# Patient Record
Sex: Male | Born: 1980 | Race: White | Hispanic: No | Marital: Married | Smoking: Current every day smoker
Health system: Southern US, Community
[De-identification: ages and names within clinical notes are randomized; demographics above are authoritative.]

---

## 2020-10-20 ENCOUNTER — Encounter (HOSPITAL_COMMUNITY): Payer: Self-pay

## 2020-10-20 ENCOUNTER — Emergency Department (HOSPITAL_COMMUNITY)
Admission: EM | Admit: 2020-10-20 | Discharge: 2020-10-20 | Disposition: A | Discharge status: Home / Self Care | Payer: Self-pay | Attending: Emergency Medicine | Admitting: Emergency Medicine

## 2020-10-20 ENCOUNTER — Emergency Department (HOSPITAL_COMMUNITY): Payer: Self-pay

## 2020-10-20 ENCOUNTER — Other Ambulatory Visit: Payer: Self-pay

## 2020-10-20 DIAGNOSIS — F17210 Nicotine dependence, cigarettes, uncomplicated: Secondary | ICD-10-CM | POA: Insufficient documentation

## 2020-10-20 DIAGNOSIS — R519 Headache, unspecified: Secondary | ICD-10-CM | POA: Insufficient documentation

## 2020-10-20 DIAGNOSIS — M79602 Pain in left arm: Secondary | ICD-10-CM | POA: Insufficient documentation

## 2020-10-20 DIAGNOSIS — R42 Dizziness and giddiness: Secondary | ICD-10-CM | POA: Insufficient documentation

## 2020-10-20 DIAGNOSIS — R202 Paresthesia of skin: Secondary | ICD-10-CM | POA: Insufficient documentation

## 2020-10-20 LAB — CBC WITH DIFFERENTIAL/PLATELET
Abs Immature Granulocytes: 0.02 10*3/uL (ref 0.00–0.07)
Basophils Absolute: 0.1 10*3/uL (ref 0.0–0.1)
Basophils Relative: 1 %
Eosinophils Absolute: 0.6 10*3/uL — ABNORMAL HIGH (ref 0.0–0.5)
Eosinophils Relative: 6 %
HCT: 44.8 % (ref 39.0–52.0)
Hemoglobin: 15.1 g/dL (ref 13.0–17.0)
Immature Granulocytes: 0 %
Lymphocytes Relative: 38 %
Lymphs Abs: 4.2 10*3/uL — ABNORMAL HIGH (ref 0.7–4.0)
MCH: 30.8 pg (ref 26.0–34.0)
MCHC: 33.7 g/dL (ref 30.0–36.0)
MCV: 91.4 fL (ref 80.0–100.0)
Monocytes Absolute: 0.8 10*3/uL (ref 0.1–1.0)
Monocytes Relative: 8 %
Neutro Abs: 5.2 10*3/uL (ref 1.7–7.7)
Neutrophils Relative %: 47 %
Platelets: 346 10*3/uL (ref 150–400)
RBC: 4.9 MIL/uL (ref 4.22–5.81)
RDW: 13.4 % (ref 11.5–15.5)
WBC: 10.9 10*3/uL — ABNORMAL HIGH (ref 4.0–10.5)
nRBC: 0 % (ref 0.0–0.2)

## 2020-10-20 LAB — BASIC METABOLIC PANEL
Anion gap: 7 (ref 5–15)
BUN: 13 mg/dL (ref 6–20)
CO2: 24 mmol/L (ref 22–32)
Calcium: 8.8 mg/dL — ABNORMAL LOW (ref 8.9–10.3)
Chloride: 109 mmol/L (ref 98–111)
Creatinine, Ser: 0.9 mg/dL (ref 0.61–1.24)
GFR, Estimated: >60 mL/min (ref >60)
Glucose, Bld: 109 mg/dL — ABNORMAL HIGH (ref 70–99)
Potassium: 4.7 mmol/L (ref 3.5–5.1)
Sodium: 140 mmol/L (ref 135–145)

## 2020-10-20 LAB — MAGNESIUM: Magnesium: 2.2 mg/dL (ref 1.7–2.4)

## 2020-10-20 MED ORDER — LIDOCAINE VISCOUS HCL 2 % MT SOLN
15.0000 mL | Freq: Once | OROMUCOSAL | Status: AC
  Administered 2020-10-20: 15 mL via ORAL
  Filled 2020-10-20: qty 15

## 2020-10-20 MED ORDER — ALUM & MAG HYDROXIDE-SIMETH 200-200-20 MG/5ML PO SUSP
30.0000 mL | Freq: Once | ORAL | Status: AC
  Administered 2020-10-20: 30 mL via ORAL
  Filled 2020-10-20: qty 30

## 2020-10-20 NOTE — ED Triage Notes (Signed)
patient states that he had left arm numbness and pain, dizziness last night. Patient states today he is still having slight dizziness and fatigue, but denies left arm numbness and pain today.

## 2020-10-20 NOTE — Discharge Instructions (Signed)
You were seen in the emergency department for left-sided arm tingling, heaviness followed by headache  Lab work, imaging today is unremarkable  Please follow-up with [facility] for further discussion of your symptoms  Return to the ED for worsening or new symptoms, sudden severe headache, neck stiffness, fevers, stroke symptoms, chest pain

## 2020-10-20 NOTE — ED Provider Notes (Signed)
[facility]-EMERGENCY DEPT Provider Note   CSN: [id number] Arrival date & time: 10/20/20  1015     History Chief Complaint  Patient presents with  . Arm Pain  . Dizziness  . arm numbness    Anthony Bird is a 40 y.o. male with reported remote history of polysubstance abuse sober for 8 years presents to the ED with his wife for evaluation of left-sided numbness.  This occurred suddenly at 7 PM last night.  Describes it as feeling tingling, like he hit his funny bone in his elbow.  The sensation was throughout the entire left arm.  Had associated feeling of heaviness in the arm as well.  Noticed a few minutes later bilateral neck pain that was actually worse on the right side.  He does not know if maybe he was tensing up from the discomfort in his arm.  This all lasted approximately 1 to 2 hours and eventually completely resolved.  Soon after developed a sudden onset moderate to severe generalized headache.  He had dizziness that he describes as feeling generally weak and lightheaded.  States he occasionally felt like he was swaying when he would stand up.  Feels confused and he elaborates that he feels like it is brain is foggy.  Wife reports he seems like he is having a hard time explaining himself but no frank slurred speech or aphasia.  Patient was actually on his way to the hospital last night but on his way there he felt like his symptoms improved and just had a headache.  So he decided to go back home.  Headache has resolved.  Symptoms in the left arm have also resolved.  Currently just feels foggy headed, really tired and worn out.  Out of caution he took a Covid test at home and this was negative.  No falls.  No previous neck injuries or pinched nerves.  No visual changes, tinnitus, difficulty swallowing, facial drooping.  No falls.  Reports in the past using several drugs including meth, cocaine, mushrooms, and heroin.  No previous IV drug use however.  Smokes marijuana  but no other recreational drugs in the last 8 years.  No alcohol use.  Tobacco use.  Used to have migraines but these have improved.  Patient went to [facility] and was told to come to ER for EKG or a possible problem with his heart. He has no CP, SOB, abdominal pain, nausea, vomiting, diaphoresis.   HPI     History reviewed. No pertinent past medical history.  There are no problems to display for this patient.   History reviewed. No pertinent surgical history.     Family History  Problem Relation Age of Onset  . Healthy Mother   . Healthy Father     Social History   Tobacco Use  . Smoking status: Current Every Day Smoker    Packs/day: 1.00    Types: Cigarettes  . Smokeless tobacco: Never Used  Vaping Use  . Vaping Use: Never used  Substance Use Topics  . Alcohol use: Not Currently  . Drug use: Yes    Types: Marijuana    Home Medications Prior to Admission medications   Medication Sig Start Date End Date Taking? Authorizing Provider  famotidine (PEPCID) 20 MG tablet Take 20 mg by mouth 2 (two) times daily.   Yes [provider]  ibuprofen (ADVIL) 200 MG tablet Take 800-1,000 mg by mouth every 6 (six) hours as needed for headache or mild pain.  Yes [provider]    Allergies    Amoxicillin  Review of Systems   Review of Systems  Constitutional: Positive for fatigue.  Musculoskeletal: Positive for neck pain (Resolved).  Neurological: Positive for dizziness, weakness (Left arm heaviness), numbness (Tingling) and headaches (Resolved).  All other systems reviewed and are negative.   Physical Exam Updated Vital Signs BP 126/77   Pulse (!) 59   Temp 98.4 F (36.9 C) (Oral)   Resp 10   Ht 6\' 3"  (1.905 m)   Wt 113.4 kg   SpO2 97%   BMI 31.25 kg/m   Physical Exam Vitals and nursing note reviewed.  Constitutional:      General: He is not in acute distress.    Appearance: He is well-developed and well-nourished.     Comments: NAD.   HENT:     Head: Normocephalic and atraumatic.     Comments: No temporal tenderness    Right Ear: External ear normal.     Left Ear: External ear normal.     Nose: Nose normal.  Eyes:     General: No scleral icterus.    Extraocular Movements: EOM normal.     Conjunctiva/sclera: Conjunctivae normal.  Neck:     Comments: No reproducible C-spine midline or paraspinal tenderness.  Full range of motion of the neck without any pain.  No carotid bruits.  Trachea midline. Cardiovascular:     Rate and Rhythm: Normal rate and regular rhythm.     Pulses: Intact distal pulses.     Heart sounds: Normal heart sounds. No murmur heard.     Comments: 1+ radial pulses bilaterally Pulmonary:     Effort: Pulmonary effort is normal.     Breath sounds: Normal breath sounds. No wheezing.  Musculoskeletal:        General: No deformity. Normal range of motion.     Cervical back: Normal range of motion and neck supple.  Skin:    General: Skin is warm and dry.     Capillary Refill: Capillary refill takes less than 2 seconds.  Neurological:     Mental Status: He is alert and oriented to person, place, and time.     Comments:   Mental Status: Patient is awake, alert, oriented to person, place, year, and situation. Patient is able to give a clear and coherent history.  Speech is fluent and clear without dysarthria or aphasia.  No signs of neglect.  Cranial Nerves: I not tested II visual fields full bilaterally. PERRL.   III, IV, VI EOMs intact without ptosis V sensation to light touch intact in all 3 divisions of trigeminal nerve bilaterally  VII facial movements symmetric bilaterally VIII hearing intact to voice/conversation  IX, X no uvula deviation, symmetric rise of soft palate/uvula XI 5/5 SCM and trapezius strength bilaterally  XII tongue protrusion midline, symmetric L/R movements  Motor: Strength 5/5 in upper/lower extremities.   Sensation to light touch intact in face, upper/lower  extremities. No pronator drift. No leg drop.  Cerebellar: No ataxia with finger to nose.  Steady gait. No truncal sway. Normal Romberg.   Psychiatric:        Mood and Affect: Mood and affect normal.        Behavior: Behavior normal.        Thought Content: Thought content normal.        Judgment: Judgment normal.     ED Results / Procedures / Treatments   Labs (all labs ordered are listed, but only  abnormal results are displayed) Labs Reviewed  CBC WITH DIFFERENTIAL/PLATELET - Abnormal; Notable for the following components:      Result Value   WBC 10.9 (*)    Lymphs Abs 4.2 (*)    Eosinophils Absolute 0.6 (*)    All other components within normal limits  BASIC METABOLIC PANEL - Abnormal; Notable for the following components:   Glucose, Bld 109 (*)    Calcium 8.8 (*)    All other components within normal limits  MAGNESIUM    EKG None  Radiology CT Head Wo Contrast  Result Date: 10/20/2020 CLINICAL DATA:  Left arm tingling and heaviness, headache, dizziness, neck pain, no history of trauma EXAM: CT HEAD WITHOUT CONTRAST CT CERVICAL SPINE WITHOUT CONTRAST TECHNIQUE: Multidetector CT imaging of the head and cervical spine was performed following the standard protocol without intravenous contrast. Multiplanar CT image reconstructions of the cervical spine were also generated. COMPARISON:  None FINDINGS: CT HEAD FINDINGS Brain: No evidence of acute infarction, hemorrhage, hydrocephalus, extra-axial collection or mass lesion/mass effect. Vascular: No hyperdense vessel or unexpected calcification. Skull: Normal. Negative for fracture or focal lesion. Sinuses/Orbits: No acute finding. Other: None CT CERVICAL SPINE FINDINGS Alignment: Mild reversal of the normal cervical lordosis. No spondylolisthesis. Skull base and vertebrae: No acute fracture. No primary bone lesion or focal pathologic process. Soft tissues and spinal canal: No prevertebral fluid or swelling. No visible canal hematoma.  Disc levels:  No significant degenerative change. Upper chest: Negative. Other: None IMPRESSION: 1. Negative for bleed or other acute intracranial process. 2. Negative for cervical fracture or other acute bone abnormality. 3. Loss of the normal cervical spine lordosis, which may be secondary to positioning, spasm, or soft tissue injury. Electronically Signed   By: Corlis Leak  Hassell M.D.   On: 10/20/2020 13:18   CT Cervical Spine Wo Contrast  Result Date: 10/20/2020 CLINICAL DATA:  Left arm tingling and heaviness, headache, dizziness, neck pain, no history of trauma EXAM: CT HEAD WITHOUT CONTRAST CT CERVICAL SPINE WITHOUT CONTRAST TECHNIQUE: Multidetector CT imaging of the head and cervical spine was performed following the standard protocol without intravenous contrast. Multiplanar CT image reconstructions of the cervical spine were also generated. COMPARISON:  None FINDINGS: CT HEAD FINDINGS Brain: No evidence of acute infarction, hemorrhage, hydrocephalus, extra-axial collection or mass lesion/mass effect. Vascular: No hyperdense vessel or unexpected calcification. Skull: Normal. Negative for fracture or focal lesion. Sinuses/Orbits: No acute finding. Other: None CT CERVICAL SPINE FINDINGS Alignment: Mild reversal of the normal cervical lordosis. No spondylolisthesis. Skull base and vertebrae: No acute fracture. No primary bone lesion or focal pathologic process. Soft tissues and spinal canal: No prevertebral fluid or swelling. No visible canal hematoma. Disc levels:  No significant degenerative change. Upper chest: Negative. Other: None IMPRESSION: 1. Negative for bleed or other acute intracranial process. 2. Negative for cervical fracture or other acute bone abnormality. 3. Loss of the normal cervical spine lordosis, which may be secondary to positioning, spasm, or soft tissue injury. Electronically Signed   By: Corlis Leak  Hassell M.D.   On: 10/20/2020 13:18    Procedures Procedures   Medications Ordered in  ED Medications - No data to display  ED Course  I have reviewed the triage vital signs and the nursing notes.  Pertinent labs & imaging results that were available during my care of the patient were reviewed by me and considered in my medical decision making (see chart for details).  Clinical Course as of 10/20/20 1523  Sat Oct 20, 2020  1321 CT Head Wo Contrast IMPRESSION: 1. Negative for bleed or other acute intracranial process. 2. Negative for cervical fracture or other acute bone abnormality. 3. Loss of the normal cervical spine lordosis, which may be secondary to positioning, spasm, or soft tissue injury. [CG]  1327 Patient case discussed with EDP, will proceed with MRI [CG]    Clinical Course User Index [CG] Liberty Handy, PA-C   MDM Rules/Calculators/A&P                          40 year old presents to the ED for left arm tingling and heaviness 7 PM last night that resolved.  Followed by sudden, moderate to severe headache, brain fogginess, dizziness, fatigue.  Remote history of polysubstance abuse, denies history of IV drug use.  EMR, triage nurse notes reviewed to obtain more history and assist with MDM.  Additional information obtained from wife was at bedside.  Reports patient seemed to have a hard time coming up with words or explaining himself but no frank slurred speech, facial drooping, weakness.  DDx: TIA, CVA, complicated migraine.  Cervical radiculopathy also considered although on exam he has no cervical tenderness, paresthesias.  No associated chest pain or shortness of breath any ACS is unlikely.  Considered vertebral dissection etc however patient without any current/active symptoms, pain. Normal distal neurovascular status. No neck rigidity, headache.  Essentially asymptomatic here.  Dissection less likely. Denies history of IVDU, septic emboli less likely.   Labs, imaging ordered by me as above  Labs and imaging personally visualized and interpreted by  me  Labs reveal-essentially unremarkable.  WC 10.9.  Glucose 109.  Imaging reveals-CT head/cervical spine without significant acute findings.  There is loss of cervical curvature that could be due to positioning, spasm or soft tissue injury.  Patient denies any falls or trauma.  No active neck pain.  EKG without ischemic changes.  Patient has no chest pain.  Patient case discussed with EDP Freida Busman who agrees with moving forward with MRI  Medicines ordered-patient is asymptomatic, none  1520: Patient reevaluated.  Remains asymptomatic.  Pending MRI brain/cervical spine.  Patient care transferred to oncoming ED PA who will follow up on pending MRI and determine disposition.  If negative and no clinical decline or new symptoms, consider discharge with neurology follow-up. ?  Complicated migraine.  Plan discussed with patient and wife were in agreement.  Final Clinical Impression(s) / ED Diagnoses Final diagnoses:  Arm paresthesia, left  Nonintractable headache, unspecified chronicity pattern, unspecified headache type    Rx / DC Orders ED Discharge Orders    None       Jerrell Mylar 10/20/20 1523    Lorre Nick, MD 10/24/20 1013

## 2020-10-20 NOTE — ED Provider Notes (Signed)
Care received from [facility].  Please see her note for full HPI.  In short, 40 year old male with left-sided numbness and weakness has been ongoing since yesterday.  Symptoms have been intermittent.  Work-up by prior provider overall reassuring.  No evidence of TIA/CVA on CT scan.  Low suspicion for carotid dissection as he has no symptoms at this time.  Neuro exam is normal.  Labs overall reassuring.  Care received pending MRI of the cervical spine and brain.  I have personally reviewed the MRI findings which were overall reassuring.  No evidence of infarction, hemorrhage or mass on MRI of the brain, cervical spine with no evidence of stenosis.  On reevaluation, patient remains asymptomatic here in the ER.  He is requesting something for heartburn, will give GI cocktail.  Will give neurology follow-up.  This was discussed with the patient and his wife, they voiced understanding and are agreeable.  Discussed return precautions.  At this stage in the ED course, the patient is medically screened and stable for discharge. Physical Exam  BP 125/81 (BP Location: Right Arm)   Pulse 77   Temp 98.4 F (36.9 C) (Oral)   Resp 18   Ht 6\' 3"  (1.905 m)   Wt 113.4 kg   SpO2 100%   BMI 31.25 kg/m   Physical Exam Vitals reviewed.  Constitutional:      Appearance: Normal appearance.  HENT:     Head: Normocephalic and atraumatic.  Eyes:     General:        Right eye: No discharge.        Left eye: No discharge.     Extraocular Movements: Extraocular movements intact.     Conjunctiva/sclera: Conjunctivae normal.  Cardiovascular:     Rate and Rhythm: Normal rate and regular rhythm.     Pulses: Normal pulses.     Heart sounds: Normal heart sounds.  Pulmonary:     Effort: Pulmonary effort is normal.     Breath sounds: Normal breath sounds.  Abdominal:     General: Abdomen is flat.     Tenderness: There is no abdominal tenderness.  Musculoskeletal:        General: No swelling. Normal range of  motion.     Cervical back: Normal range of motion and neck supple. No tenderness.     Right lower leg: No edema.     Left lower leg: No edema.  Skin:    General: Skin is warm and dry.  Neurological:     General: No focal deficit present.     Mental Status: He is alert and oriented to person, place, and time.     Comments: Mental Status:  Alert, thought content appropriate, able to give a coherent history. Speech fluent without evidence of aphasia. Able to follow 2 step commands without difficulty.  Cranial Nerves:  II: Peripheral visual fields grossly normal, pupils equal, round, reactive to light III,IV, VI: ptosis not present, extra-ocular motions intact bilaterally  V,VII: smile symmetric, facial light touch sensation equal VIII: hearing grossly normal to voice  X: uvula elevates symmetrically  XI: bilateral shoulder shrug symmetric and strong XII: midline tongue extension without fassiculations Motor:  Normal tone. 5/5 strength of BUE and BLE major muscle groups including strong and equal grip strength and dorsiflexion/plantar flexion Sensory: light touch normal in all extremities. Cerebellar: normal finger-to-nose with bilateral upper extremities, Romberg sign absent Gait: normal gait and balance. Able to walk on toes and heels with ease.    Psychiatric:  Mood and Affect: Mood normal.        Behavior: Behavior normal.     ED Course/Procedures   Clinical Course as of 10/20/20 1612  Sat Oct 20, 2020  1321 CT Head Wo Contrast IMPRESSION: 1. Negative for bleed or other acute intracranial process. 2. Negative for cervical fracture or other acute bone abnormality. 3. Loss of the normal cervical spine lordosis, which may be secondary to positioning, spasm, or soft tissue injury. [CG]  1327 Patient case discussed with EDP, will proceed with MRI [CG]  1611 MR BRAIN WO CONTRAST  IMPRESSION: No evidence of recent infarction, hemorrhage, or mass.  [MB]  1611 MR Cervical  Spine Wo Contrast IMPRESSION: No abnormal signal. No stenosis. [MB]    Clinical Course User Index [CG] Liberty Handy, PA-C [MB] Mare Ferrari, PA-C    Procedures  MDM        Leone Brand 10/20/20 1613    Gerhard Munch, MD 10/21/20 0005

## 2022-05-11 IMAGING — CT CT HEAD W/O CM
2 of 3 series · 15 of 44 positions shown, 18 images · non-contrast
Comparison: None

CLINICAL DATA: Left arm tingling and heaviness, headache,
dizziness, neck pain, no history of trauma

EXAM:
CT HEAD WITHOUT CONTRAST
CT CERVICAL SPINE WITHOUT CONTRAST
TECHNIQUE: Multidetector CT imaging of the head and cervical spine was
performed following the standard protocol without intravenous
contrast. Multiplanar CT image reconstructions of the cervical spine
were also generated.

[Series 3: head wo · axial · 0.47mm/px · z∈[-150,-40]mm · 12 of 27 slices shown, 15 images]
[im 3/27  brain]
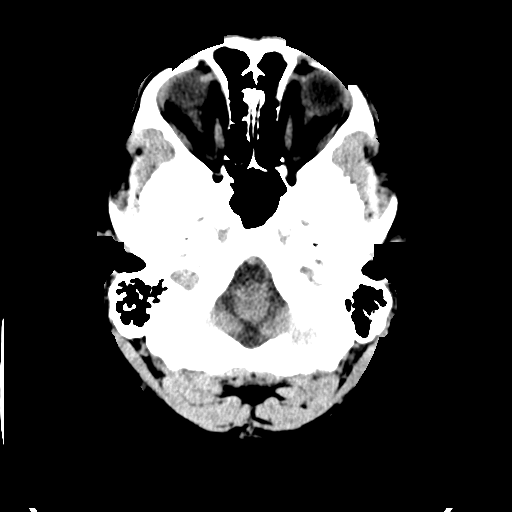
[im 3/27  bone]
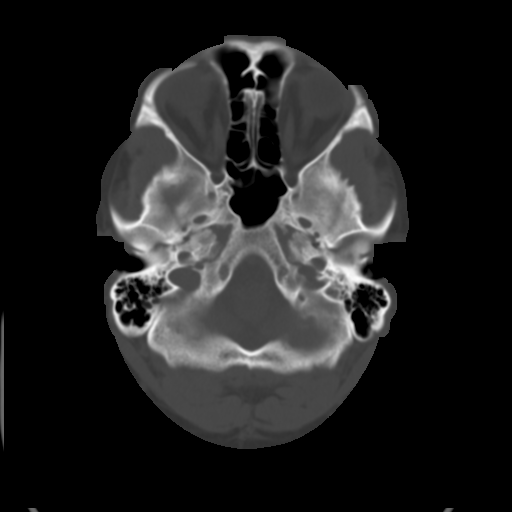
[im 5/27  brain]
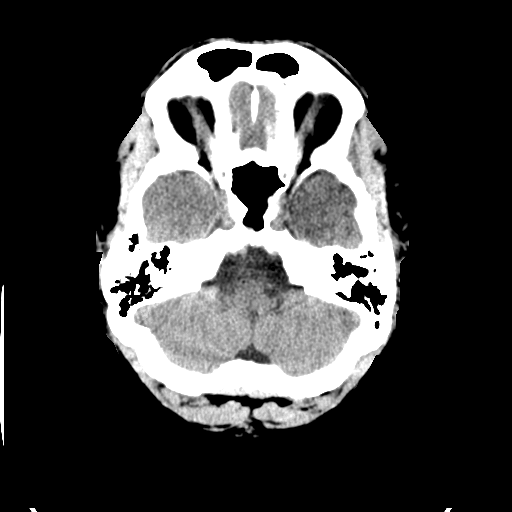
[im 7/27  brain]
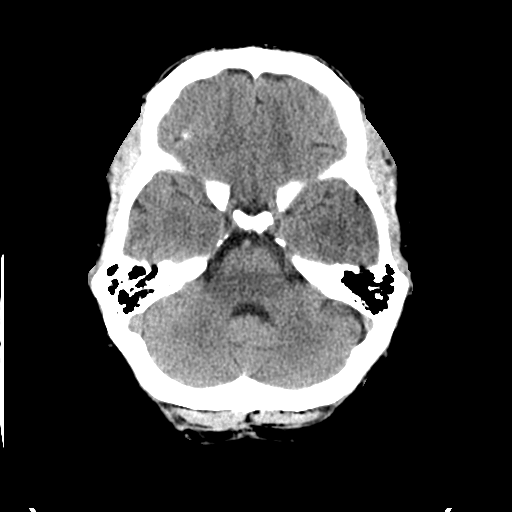
[im 9/27  brain]
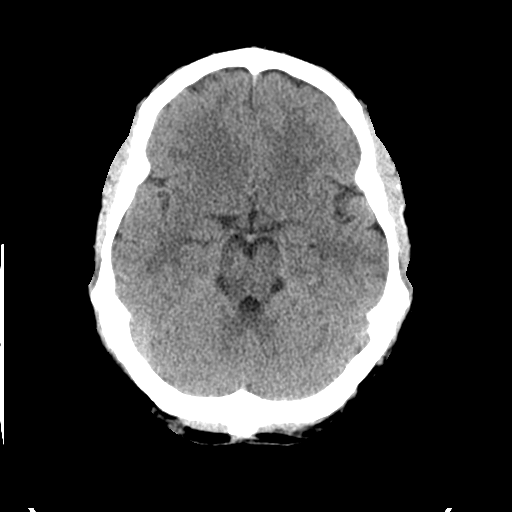
[im 11/27  brain]
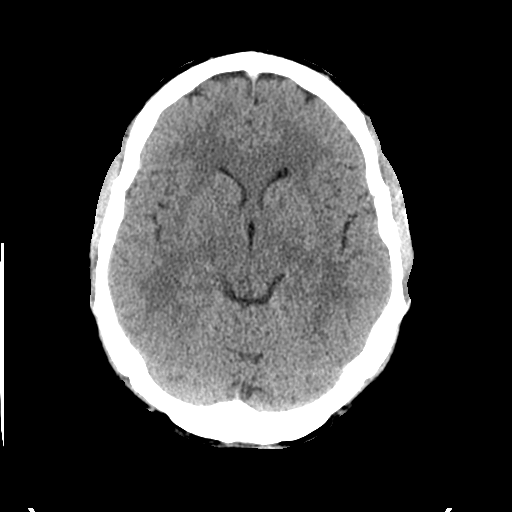
[im 11/27  bone]
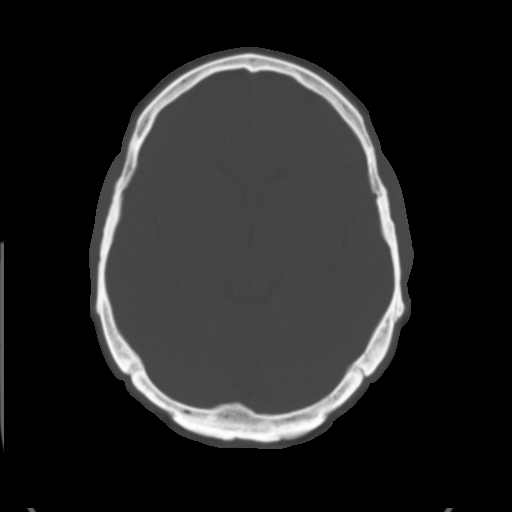
[im 13/27  brain]
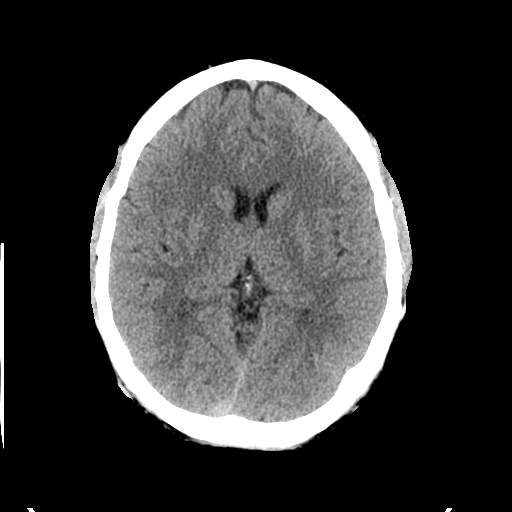
[im 15/27  brain]
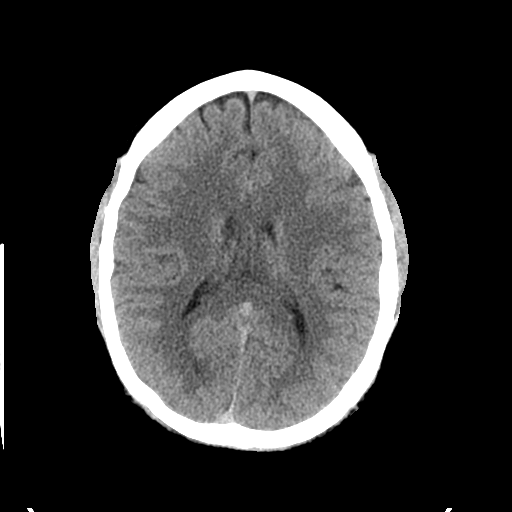
[im 17/27  brain]
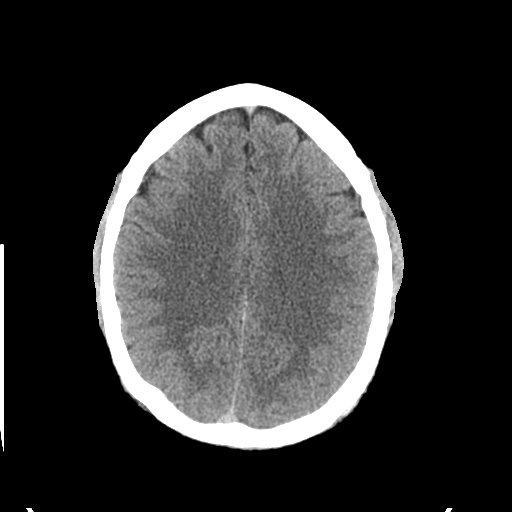
[im 19/27  brain]
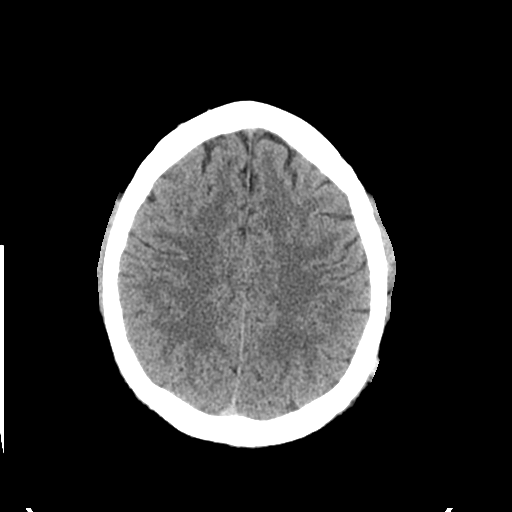
[im 19/27  bone]
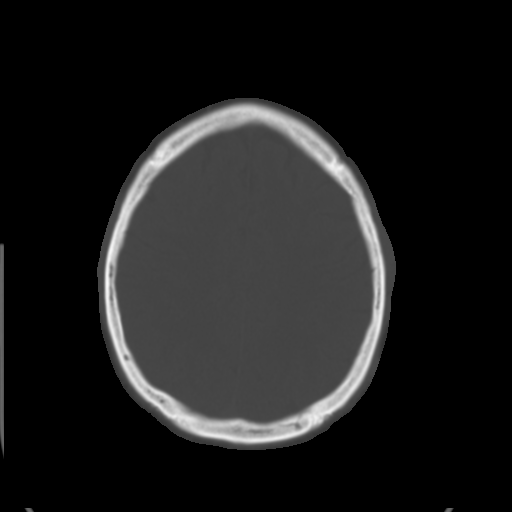
[im 21/27  brain]
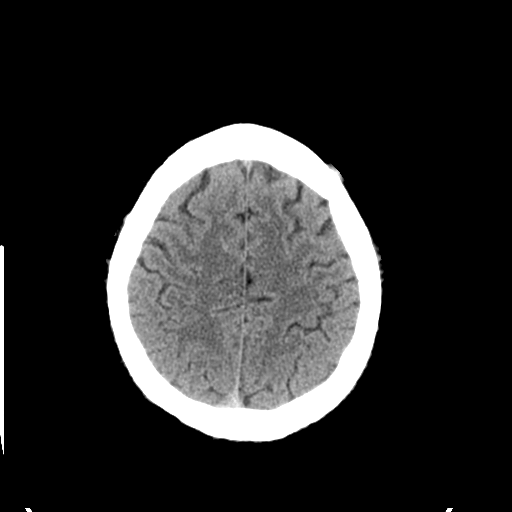
[im 23/27  brain]
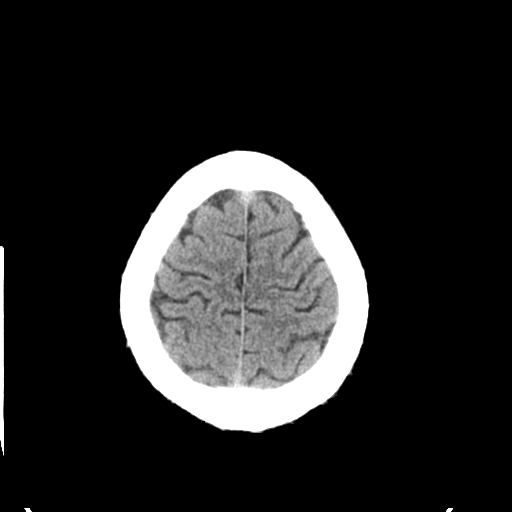
[im 25/27  brain]
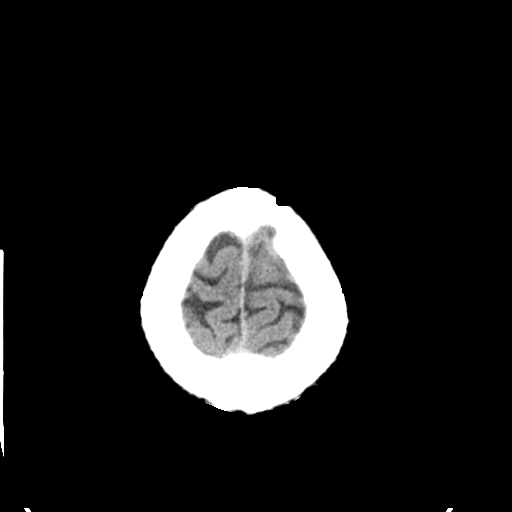

[Series 6: coronal soft tissue · coronal · 0.27mm/px · 3 of 81 slices shown]
[im 27/81  brain]
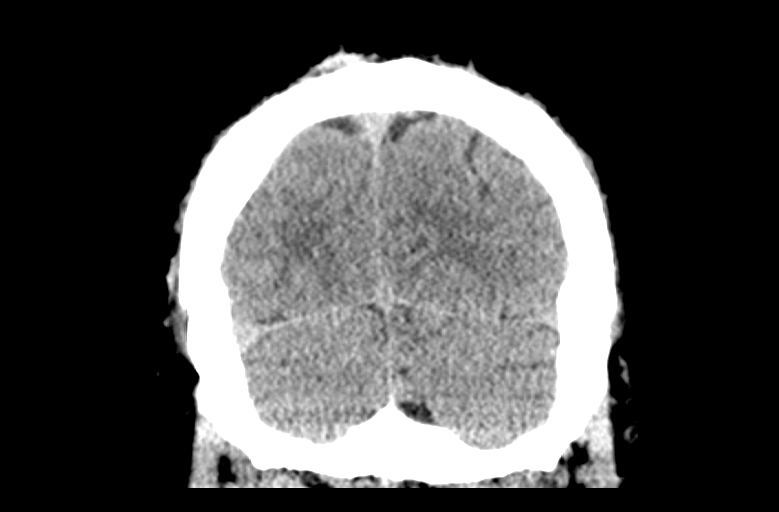
[im 36/81  brain]
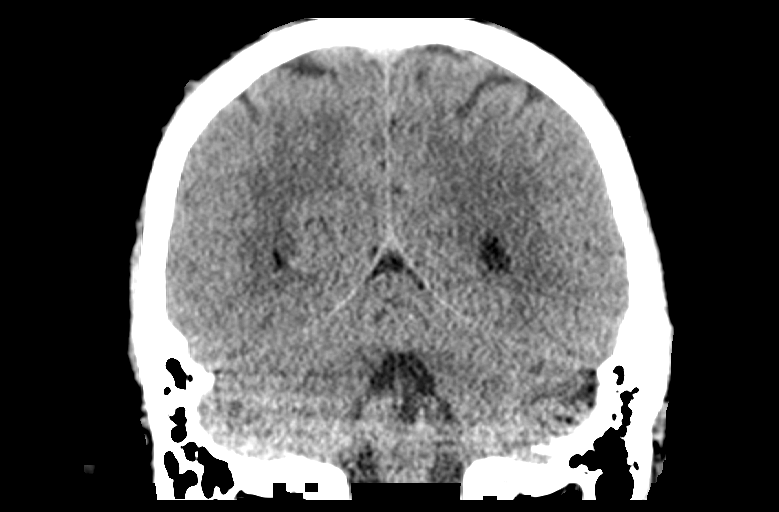
[im 45/81  brain]
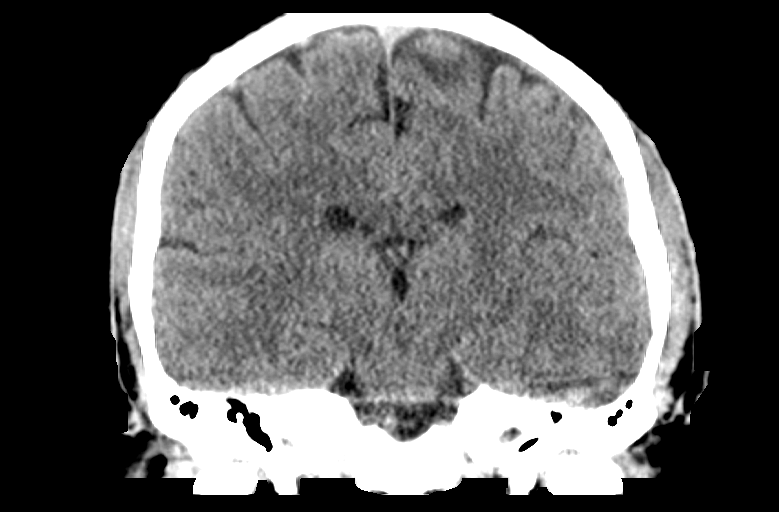

[15 of 44 positions shown; findings below may reference images not displayed]

FINDINGS: CT HEAD FINDINGS

Brain: No evidence of acute infarction, hemorrhage, hydrocephalus,
extra-axial collection or mass lesion/mass effect.

Vascular: No hyperdense vessel or unexpected calcification.

Skull: Normal. Negative for fracture or focal lesion.

Sinuses/Orbits: No acute finding.

Other: None

CT CERVICAL SPINE FINDINGS

Alignment: Mild reversal of the normal cervical lordosis. No
spondylolisthesis.

Skull base and vertebrae: No acute fracture. No primary bone lesion
or focal pathologic process.

Soft tissues and spinal canal: No prevertebral fluid or swelling. No
visible canal hematoma.

Disc levels:  No significant degenerative change.

Upper chest: Negative.

Other: None
IMPRESSION: 1. Negative for bleed or other acute intracranial process.
2. Negative for cervical fracture or other acute bone abnormality.
3. Loss of the normal cervical spine lordosis, which may be
secondary to positioning, spasm, or soft tissue injury.
